# Patient Record
Sex: Male | Born: 1965 | Race: White | Hispanic: No | Smoking: Never smoker
Health system: Southern US, Community
[De-identification: ages and names within clinical notes are randomized; demographics above are authoritative.]

## PROBLEM LIST (undated history)

## (undated) DIAGNOSIS — G4733 Obstructive sleep apnea (adult) (pediatric): Secondary | ICD-10-CM

## (undated) DIAGNOSIS — F32A Depression, unspecified: Secondary | ICD-10-CM

## (undated) DIAGNOSIS — I10 Essential (primary) hypertension: Secondary | ICD-10-CM

## (undated) DIAGNOSIS — F329 Major depressive disorder, single episode, unspecified: Secondary | ICD-10-CM

## (undated) DIAGNOSIS — R0789 Other chest pain: Principal | ICD-10-CM

## (undated) DIAGNOSIS — K219 Gastro-esophageal reflux disease without esophagitis: Secondary | ICD-10-CM

## (undated) DIAGNOSIS — Z9989 Dependence on other enabling machines and devices: Secondary | ICD-10-CM

## (undated) DIAGNOSIS — R7989 Other specified abnormal findings of blood chemistry: Secondary | ICD-10-CM

## (undated) HISTORY — DX: Essential (primary) hypertension: I10

## (undated) HISTORY — DX: Other chest pain: R07.89

## (undated) HISTORY — DX: Other specified abnormal findings of blood chemistry: R79.89

## (undated) HISTORY — DX: Obstructive sleep apnea (adult) (pediatric): G47.33

## (undated) HISTORY — DX: Dependence on other enabling machines and devices: Z99.89

---

## 2006-02-17 HISTORY — PX: RHINOPLASTY: SUR1284

## 2006-08-13 ENCOUNTER — Ambulatory Visit (HOSPITAL_COMMUNITY): Admission: RE | Admit: 2006-08-13 | Discharge: 2006-08-14 | Payer: Self-pay | Admitting: Otolaryngology

## 2006-08-13 ENCOUNTER — Encounter (INDEPENDENT_AMBULATORY_CARE_PROVIDER_SITE_OTHER): Payer: Self-pay | Admitting: Otolaryngology

## 2010-04-15 ENCOUNTER — Other Ambulatory Visit: Payer: Self-pay | Admitting: Dermatology

## 2010-07-02 NOTE — Op Note (Signed)
NAMEYUVRAJ, PFEIFER                ACCOUNT NO.:  [id number]   MEDICAL RECORD NO.:  [id number]          PATIENT TYPE:  AMB   LOCATION:  SDS                          FACILITY:  [facility]   PHYSICIAN:  Newman Pies, MD            DATE OF BIRTH:  [birth year]   DATE OF PROCEDURE:  08/13/2006  DATE OF DISCHARGE:                               OPERATIVE REPORT   SURGEON:  Newman Pies, MD   PREOPERATIVE DIAGNOSES:  1. Significant adenoid hypertrophy.  2. Chronic nasal obstruction.  3. Severe nasal septal deviation.  4. Obstructive sleep apnea.   POSTOPERATIVE DIAGNOSES:  1. Significant adenoid hypertrophy.  2. Chronic nasal obstruction.  3. Severe nasal septal deviation.  4. Obstructive sleep apnea.   PROCEDURE PERFORMED:  1. Adenoidectomy.  2. Septoplasty.   ANESTHESIA:  General endotracheal tube anesthesia.   COMPLICATIONS:  None.   ESTIMATED BLOOD LOSS:  100 mL.   INDICATIONS FOR PROCEDURE:  Mr. Patrick Santos is a 45 year old white male  with a history of chronic nasal obstruction, obstructive sleep apnea,  and hyponasal voice.  On examination, he was noted to have significant  nasal septal deviation and significant adenoid hypertrophy.  On  fiberoptic nasopharyngoscopy, the adenoid was noted to be completely  obstructing the nasopharynx and choanal opening.  Based on the findings,  the decision was made for the patient to undergo adenoidectomy and  septoplasty.  The risks, benefits, alternatives, and details of the  procedure were discussed with the patient.  He would like to proceed  with no surgery.   DESCRIPTION OF PROCEDURE:  The patient was taken to the operating room  and placed supine on the operating table.  General endotracheal tube  anesthesia was administered by the anesthesiologist.  Preop IV  antibiotic and Decadron were given.  Next the patient was then  positioned and prepped and draped in standard fashion for adenoidectomy.  A Crowe-Davis mouth gag was inserted into the  oral cavity for exposure.  Inspection of the oral cavity reveals no submucous cleft or bifidity.  The patient has 1+ tonsils bilaterally.  However, he has a significantly  redundant oropharyngeal mucosa, especially the posterior tonsillar  pillar.  A red rubber catheter was inserted via the left nostril and was  used to gently retract the soft palate.  Indirect mirror examination of  the nasopharynx reveals significant adenoid hyperplasia, completely  obstructing the nasopharynx.  The adenoid was resected using the  electrocut adenotonsillar.  Hemostasis was achieved using the suction  electrocautery.   Attention was then turned towards the nose.  The patient was  repositioned and prepped and draped in a standard fashion for  septoplasty.  1% lidocaine 1:100,000 epinephrine was injected onto the  nasal septum bilaterally.  Hemitransfixion incision was made via the  left nostril.  The submucosal perichondrial flap was elevated on the  left side.  The patient is noted to have a significantly deviated nasal  septum, towards the left side.  A large bony spur is noted to be  projecting into the nasal cavity.  The  mucosa was carefully elevated  around the septal spur.  The submucoperichondrial flap was also elevated  on the contralateral side.  The flap was elevated posteriorly, in  continuity with a subperiosteal flap.  The deviated portion of the  cartilaginous and bony nasal septum was resected and removed.  A 1 cm  perforation was noted on the left mucosal flap.  The perforation was  repaired using 4-0 plain gut suture.  The septum was then quilted using  4-0 plain gut suture on a Keith needle.  Doyle splints were then applied  to both sides of the nasal septum.  At this time it is also noted that  the patient has significant turbinate hypertrophy, especially on the  right side.  The nasal septum was cauterized using a bipolar needle  cautery.  That concluded procedure for the patient.   The care of the  patient was turned over to the anesthesiologist.  The patient was  awakened from anesthesia without difficulty.  He was extubated and  transferred to the recovery room in good condition.   OPERATIVE FINDINGS:  1. Significant adenoid hypertrophy, completely obstructing the      nasopharynx.  Adenoids was resected free.  2. Significant septal deviation to the left.  A large septal spur is      also noted.   SPECIMENS REMOVED:  Adenoid tissue.   FOLLOW-UP:  The patient will be observed in the postanesthetic care  unit.  He will be subsequently transferred to the overnight observation  unit.  He will be discharged home on postop day #1.  He will be placed  on amoxicillin 500 mg p.o. t.i.d. for 7 days.  In addition, he may take  Percocet one to two tablet p.o. q.4-6 h p.r.n. pain.      Newman Pies, MD  Electronically Signed     ST/MEDQ  D:  08/13/2006  T:  08/13/2006  Job:  [id number]

## 2010-12-04 LAB — CBC
MCHC: 32.6
Platelets: 418 — ABNORMAL HIGH
RBC: 4.54
RDW: 15.5 — ABNORMAL HIGH

## 2012-12-11 ENCOUNTER — Encounter (HOSPITAL_COMMUNITY): Payer: Self-pay | Admitting: Emergency Medicine

## 2012-12-11 ENCOUNTER — Emergency Department (HOSPITAL_COMMUNITY)
Admission: EM | Admit: 2012-12-11 | Discharge: 2012-12-11 | Disposition: A | Discharge status: Home / Self Care | Payer: Managed Care, Other (non HMO) | Attending: Emergency Medicine | Admitting: Emergency Medicine

## 2012-12-11 DIAGNOSIS — K644 Residual hemorrhoidal skin tags: Secondary | ICD-10-CM | POA: Insufficient documentation

## 2012-12-11 DIAGNOSIS — F329 Major depressive disorder, single episode, unspecified: Secondary | ICD-10-CM | POA: Insufficient documentation

## 2012-12-11 DIAGNOSIS — F411 Generalized anxiety disorder: Secondary | ICD-10-CM | POA: Insufficient documentation

## 2012-12-11 DIAGNOSIS — G4700 Insomnia, unspecified: Secondary | ICD-10-CM | POA: Insufficient documentation

## 2012-12-11 DIAGNOSIS — F3289 Other specified depressive episodes: Secondary | ICD-10-CM | POA: Insufficient documentation

## 2012-12-11 DIAGNOSIS — E291 Testicular hypofunction: Secondary | ICD-10-CM | POA: Insufficient documentation

## 2012-12-11 DIAGNOSIS — R41840 Attention and concentration deficit: Secondary | ICD-10-CM | POA: Insufficient documentation

## 2012-12-11 DIAGNOSIS — F39 Unspecified mood [affective] disorder: Secondary | ICD-10-CM | POA: Insufficient documentation

## 2012-12-11 HISTORY — DX: Depression, unspecified: F32.A

## 2012-12-11 HISTORY — DX: Gastro-esophageal reflux disease without esophagitis: K21.9

## 2012-12-11 HISTORY — DX: Major depressive disorder, single episode, unspecified: F32.9

## 2012-12-11 LAB — COMPREHENSIVE METABOLIC PANEL
ALT: 15 U/L (ref 0–53)
AST: 15 U/L (ref 0–37)
Alkaline Phosphatase: 61 U/L (ref 39–117)
CO2: 26 mEq/L (ref 19–32)
Chloride: 101 mEq/L (ref 96–112)
GFR calc Af Amer: >90 mL/min (ref >90)
GFR calc non Af Amer: >90 mL/min (ref >90)
Glucose, Bld: 157 mg/dL — ABNORMAL HIGH (ref 70–99)
Potassium: 3.9 mEq/L (ref 3.5–5.1)
Sodium: 138 mEq/L (ref 135–145)

## 2012-12-11 LAB — CBC WITH DIFFERENTIAL/PLATELET
Lymphocytes Relative: 22 % (ref 12–46)
Lymphs Abs: 1.1 10*3/uL (ref 0.7–4.0)
MCV: 87.4 fL (ref 78.0–100.0)
Neutro Abs: 3.7 10*3/uL (ref 1.7–7.7)
Neutrophils Relative %: 72 % (ref 43–77)
Platelets: 285 10*3/uL (ref 150–400)
RBC: 5.14 MIL/uL (ref 4.22–5.81)
WBC: 5.1 10*3/uL (ref 4.0–10.5)

## 2012-12-11 LAB — URINALYSIS, ROUTINE W REFLEX MICROSCOPIC
Bilirubin Urine: NEGATIVE
Glucose, UA: 100 mg/dL — AB
Leukocytes, UA: NEGATIVE
Protein, ur: NEGATIVE mg/dL
Specific Gravity, Urine: 1.029 (ref 1.005–1.030)
pH: 6.5 (ref 5.0–8.0)

## 2012-12-11 LAB — T4, FREE: Free T4: 1.24 ng/dL (ref 0.80–1.80)

## 2012-12-11 LAB — TSH: TSH: 0.602 u[IU]/mL (ref 0.350–4.500)

## 2012-12-11 NOTE — ED Notes (Signed)
Pt reports is going through a separation, hasn't been sleeping well for 2 months. Yesterday went to urgent care and was prescribed Temazepam. Last night slept 3 hours, prior to that hasn't slept since Thursday. Reports wt loss of 27 lbs in 2 months, has cpap machine and thinks the setting may be wrong. Also states while he is here, he thinks he has a prolapsed rectum and hemorrhoid.

## 2012-12-11 NOTE — ED Provider Notes (Signed)
CSN: [id number]     Arrival date & time 12/11/12  0720 History   First MD Initiated Contact with Patient 12/11/12 [phone]     Chief Complaint  Patient presents with  . Insomnia    HPI  Patient presents with concerns of insomnia, and rectal lesion.  Concern #1. Patient states that he is going through separation/divorce.  Since this process started approximately 2 months ago he has had very poor sleep, with multiple nights of minimal sleep. He has seen urgent care providers for several times, including yesterday.    During this timeframe he has had persistent depression, anxiety, fixation on his life situation.  He specifically denies suicidal thoughts, homicidal thoughts, hallucinations. Regardless, the patient states that he has been unable to sleep, unable to work in a typical fashion. During this time he has lost 27 pounds.  Previously the patient was taking Xanax. Yesterday he was prescribed temazepam and Lexapro. He states that he continues to take his Xanax, 0.5 mg, as needed.  he has taken one dose of Lexapro.  Concern #2. Patient states that 4 weeks to months he has had a discomfort, worse with bowel movements.  Pain improves when not defecating. He is concerned of colon cancer. He had a colonoscopy several years ago due to persistent GERD. He reports that this study was unremarkable.  In addition to the patient's concern of insomnia, he notes concern for ongoing low testosterone.  He receives shots biweekly. Throughout this illness the patient has been speaking with his psychologist, has no psychiatric care.   Past Medical History  Diagnosis Date  . Depression   . GERD (gastroesophageal reflux disease)    History reviewed. No pertinent past surgical history. No family history on file. History  Substance Use Topics  . Smoking status: Never Smoker   . Smokeless tobacco: Not on file  . Alcohol Use: No    Review of Systems  Constitutional:       Per HPI, otherwise  negative  HENT:       Per HPI, otherwise negative  Respiratory:       Per HPI, otherwise negative  Cardiovascular:       Per HPI, otherwise negative  Gastrointestinal: Negative for nausea and vomiting.  Endocrine:       Negative aside from HPI  Genitourinary:       Neg aside from HPI   Musculoskeletal:       Per HPI, otherwise negative  Skin: Negative for wound.  Neurological: Negative for syncope.  Psychiatric/Behavioral: Positive for sleep disturbance, dysphoric mood and decreased concentration. Negative for suicidal ideas, hallucinations, behavioral problems, confusion, self-injury and agitation. The patient is nervous/anxious. The patient is not hyperactive.     Allergies  Review of patient's allergies indicates no known allergies.  Home Medications  No current outpatient prescriptions on file. BP 125/80  Pulse 92  Temp(Src) 97.8 F (36.6 C)  Ht 5\' 10"  (1.778 m)  Wt 179 lb (81.194 kg)  BMI 25.68 kg/m2  SpO2 100% Physical Exam  Nursing note and vitals reviewed. Constitutional: He is oriented to person, place, and time. He appears well-developed. No distress.  HENT:  Head: Normocephalic and atraumatic.  Eyes: Conjunctivae and EOM are normal.  Cardiovascular: Normal rate and regular rhythm.   Pulmonary/Chest: Effort normal. No stridor. No respiratory distress.  Abdominal: He exhibits no distension.  Genitourinary: Rectal exam shows external hemorrhoid. Rectal exam shows no fissure, no mass, no tenderness and anal tone normal.  Musculoskeletal: He exhibits  no edema.  Neurological: He is alert and oriented to person, place, and time.  Skin: Skin is warm and dry.  Psychiatric: Judgment normal. His mood appears not anxious. His affect is labile. His affect is not angry, not blunt and not inappropriate. His speech is not slurred. He is not actively hallucinating. Thought content is not paranoid and not delusional. Cognition and memory are normal. He exhibits a depressed mood.  He expresses no homicidal and no suicidal ideation. He expresses no suicidal plans and no homicidal plans. He is communicative. He is attentive.    ED Course  Procedures (including critical care time) Labs Review Labs Reviewed - No data to display Imaging Review No results found.  EKG Interpretation   None      9:55 AM I discussed all results with the patient.  He notes that he had a breakfast just prior to arrival. We specifically discussed return precautions, the need for further evaluation, management, monitoring with outpatient physicians. MDM  No diagnosis found. Patient presents with emotional lability, concern for insomnia, concern for hemorrhoids.  On exam the patient is medically stable, awake, alert, appropriate interactive.  He exhibits some evidence of depression, but no suicidal ideation, no homicidal ideation, no delusional thoughts.  Given the patient's recent prescription for new antidepressant, no longer acting benzodiazepine, we discussed these medications at length, the need for attempt with these, with close outpatient monitoring.  Patient was discharged in stable condition.    Gerhard Munch, MD 12/11/12 [phone]

## 2016-10-13 ENCOUNTER — Emergency Department (HOSPITAL_COMMUNITY): Payer: Commercial Managed Care - PPO

## 2016-10-13 ENCOUNTER — Emergency Department (HOSPITAL_COMMUNITY)
Admission: EM | Admit: 2016-10-13 | Discharge: 2016-10-13 | Discharge status: Against Medical Advice | Payer: Commercial Managed Care - PPO | Attending: Emergency Medicine | Admitting: Emergency Medicine

## 2016-10-13 ENCOUNTER — Encounter (HOSPITAL_COMMUNITY): Payer: Self-pay

## 2016-10-13 DIAGNOSIS — R079 Chest pain, unspecified: Secondary | ICD-10-CM | POA: Diagnosis present

## 2016-10-13 DIAGNOSIS — Z79899 Other long term (current) drug therapy: Secondary | ICD-10-CM | POA: Insufficient documentation

## 2016-10-13 LAB — CBC
HCT: 43.8 % (ref 39.0–52.0)
Hemoglobin: 15 g/dL (ref 13.0–17.0)
MCH: 30.9 pg (ref 26.0–34.0)
MCHC: 34.2 g/dL (ref 30.0–36.0)
MCV: 90.1 fL (ref 78.0–100.0)
Platelets: 263 10*3/uL (ref 150–400)
RBC: 4.86 MIL/uL (ref 4.22–5.81)
RDW: 13.7 % (ref 11.5–15.5)
WBC: 5.7 10*3/uL (ref 4.0–10.5)

## 2016-10-13 LAB — HEPATIC FUNCTION PANEL
ALT: 21 U/L (ref 17–63)
AST: 25 U/L (ref 15–41)
Albumin: 3.8 g/dL (ref 3.5–5.0)
Alkaline Phosphatase: 63 U/L (ref 38–126)
Bilirubin, Direct: 0.3 mg/dL (ref 0.1–0.5)
Indirect Bilirubin: 0.8 mg/dL (ref 0.3–0.9)
TOTAL PROTEIN: 6.7 g/dL (ref 6.5–8.1)
Total Bilirubin: 1.1 mg/dL (ref 0.3–1.2)

## 2016-10-13 LAB — BASIC METABOLIC PANEL
Anion gap: 9 (ref 5–15)
BUN: 15 mg/dL (ref 6–20)
CHLORIDE: 102 mmol/L (ref 101–111)
CO2: 27 mmol/L (ref 22–32)
Calcium: 9.3 mg/dL (ref 8.9–10.3)
Creatinine, Ser: 1.02 mg/dL (ref 0.61–1.24)
GFR calc Af Amer: >60 mL/min (ref >60)
Glucose, Bld: 108 mg/dL — ABNORMAL HIGH (ref 65–99)
Potassium: 3.7 mmol/L (ref 3.5–5.1)
SODIUM: 138 mmol/L (ref 135–145)

## 2016-10-13 LAB — I-STAT TROPONIN, ED
Troponin i, poc: 0 ng/mL (ref 0.00–0.08)
Troponin i, poc: 0 ng/mL (ref 0.00–0.08)

## 2016-10-13 MED ORDER — NITROGLYCERIN 0.4 MG SL SUBL: 0.4000 mg | SUBLINGUAL_TABLET | SUBLINGUAL | Status: DC | PRN

## 2016-10-13 MED ORDER — AMLODIPINE BESYLATE 2.5 MG PO TABS: 2.5000 mg | ORAL_TABLET | Freq: Every day | ORAL | 0 refills | Status: AC

## 2016-10-13 MED ORDER — ASPIRIN 81 MG PO CHEW: 324.0000 mg | CHEWABLE_TABLET | Freq: Once | ORAL | Status: AC

## 2016-10-13 NOTE — ED Notes (Signed)
ED Provider at bedside. 

## 2016-10-13 NOTE — ED Triage Notes (Signed)
Pt BIB [facility] from work meeting where pt had sudden onset of CP with radiation to left arm and left neck.Pt reports he was diaphoretic in his back. Pt reports similar episode X1 week ago but did not get evaluated. 324 ASA PTA. No nitro. Pt A&O on arrival, denies pain.

## 2016-10-13 NOTE — ED Notes (Signed)
Patient transported to X-ray 

## 2016-10-13 NOTE — ED Provider Notes (Signed)
[facility]ENCY DEPT Provider Note   CSN: [id number] Arrival date & time: 10/13/16  1256     History   Chief Complaint Chief Complaint  Patient presents with  . Chest Pain    HPI Patrick Santos is a 51 y.o. male.  HPI   Presents with chest pain. Lasted approximately 5 minutes at the highest severity, left sided chest tightness radiating to left neck and arm. Reports associated dyspnea, diaphoresis.  Reports this occurred 3-4 other times over last few months, has occurred spontaneously. This was the worst episode, had significant diaphoresis. Denies nausea, vomiting, headaches, abdominal pain. En route had mild 1/10 tightness in the chest. Received ASA but not nitro. Reports his pain is now 0/10.  Reports he does walk regularly without chest pain and that he has not had any exertional episodes, and that episode today was spontaneous.  No hx of DVT/PE/ACS. NO recent travel or immobilization or surgery.  Denies hx of smoking or family hx of CAD.   Past Medical History:  Diagnosis Date  . Depression   . GERD (gastroesophageal reflux disease)     There are no active problems to display for this patient.   Past Surgical History:  Procedure Laterality Date  . RHINOPLASTY  2008       Home Medications    Prior to Admission medications   Medication Sig Start Date End Date Taking? Authorizing Provider  CALCIUM PO Take 1 tablet by mouth See admin instructions. Take 1 tablet by mouth 5 times weekly   Yes [provider]  esomeprazole (NEXIUM) 20 MG capsule Take 20 mg by mouth daily at 12 noon.   Yes [provider]  glucosamine-chondroitin 500-400 MG tablet Take 1 tablet by mouth once a week.   Yes [provider]  OVER THE COUNTER MEDICATION Take 1 tablet by mouth daily as needed (for allergies). OTC [employer] Brand Allergy   Yes [provider]  Testosterone 10 MG/ACT (2%) GEL Place 2 Pump onto the skin See admin instructions. Apply 2 pumps to  both thighs daily 09/29/16  Yes [provider]  amLODipine (NORVASC) 2.5 MG tablet Take 1 tablet (2.5 mg total) by mouth daily. 10/13/16   Patrick Monday, MD    Family History No family history on file.  Social History Social History  Substance Use Topics  . Smoking status: Never Smoker  . Smokeless tobacco: Never Used  . Alcohol use No     Allergies   Shellfish allergy   Review of Systems Review of Systems  Constitutional: Positive for diaphoresis and fatigue. Negative for fever.  HENT: Negative for sore throat.   Eyes: Negative for visual disturbance.  Respiratory: Positive for shortness of breath.   Cardiovascular: Positive for chest pain.  Gastrointestinal: Negative for abdominal pain, nausea and vomiting.  Genitourinary: Negative for difficulty urinating.  Musculoskeletal: Negative for back pain and neck stiffness.  Skin: Negative for rash.  Neurological: Positive for light-headedness. Negative for syncope and headaches.     Physical Exam Updated Vital Signs BP (!) 140/98   Pulse 75   Temp 98.6 F (37 C) (Oral)   Resp 18   Ht 5\' 9"  (1.753 m)   Wt 99.8 kg (220 lb)   SpO2 99%   BMI 32.49 kg/m   Physical Exam  Constitutional: He is oriented to person, place, and time. He appears well-developed and well-nourished. No distress.  HENT:  Head: Normocephalic and atraumatic.  Eyes: Conjunctivae and EOM are normal.  Neck: Normal  range of motion.  Cardiovascular: Normal rate, regular rhythm, normal heart sounds and intact distal pulses.  Exam reveals no gallop and no friction rub.   No murmur heard. Pulmonary/Chest: Effort normal and breath sounds normal. No respiratory distress. He has no wheezes. He has no rales.  Abdominal: Soft. He exhibits no distension. There is no tenderness. There is no guarding.  Musculoskeletal: He exhibits no edema.  Neurological: He is alert and oriented to person, place, and time.  Skin: Skin is warm and dry. He is not  diaphoretic.  Nursing note and vitals reviewed.    ED Treatments / Results  Labs (all labs ordered are listed, but only abnormal results are displayed) Labs Reviewed  BASIC METABOLIC PANEL - Abnormal; Notable for the following:       Result Value   Glucose, Bld 108 (*)    All other components within normal limits  CBC  HEPATIC FUNCTION PANEL  I-STAT TROPONIN, ED  I-STAT TROPONIN, ED    EKG  EKG Interpretation  Date/Time:  Santos October 13 2016 12:56:57 EDT Ventricular Rate:  70 PR Interval:    QRS Duration: 105 QT Interval:  394 QTC Calculation: 426 R Axis:   -24 Text Interpretation:  Sinus rhythm Borderline left axis deviation RSR' in V1 or V2, right VCD or RVH No previous ECGs available Confirmed by Patrick Santos ([id number]) on 10/13/2016 1:08:25 PM Also confirmed by Patrick Santos ([id number]), editor Misty Stanley [phone])  on 10/13/2016 2:26:47 PM       Radiology Dg Chest 2 View  Result Date: 10/13/2016 CLINICAL DATA:  Chest pain EXAM: CHEST  2 VIEW COMPARISON:  None. FINDINGS: Normal heart size. Retrocardiac density is most likely due to a small to moderate hiatal hernia. Otherwise normal mediastinal contour. No pneumothorax. No pleural effusion. Lungs appear clear, with no acute consolidative airspace disease and no pulmonary edema. IMPRESSION: No active cardiopulmonary disease. Small to moderate hiatal hernia. Electronically Signed   By: Delbert Phenix M.D.   On: 10/13/2016 14:03    Procedures Procedures (including critical care time)  Medications Ordered in ED Medications  aspirin chewable tablet 324 mg (324 mg Oral Given by EMS 10/13/16 1347)     Initial Impression / Assessment and Plan / ED Course  I have reviewed the triage vital signs and the nursing notes.  Pertinent labs & imaging results that were available during my care of the patient were reviewed by me and considered in my medical decision making (see chart for details).    51 year old male  with history with history of GERD, OSA presents with concern for chest pain. Differential diagnosis for chest pain includes pulmonary embolus, dissection, pneumothorax, pneumonia, ACS, myocarditis, pericarditis.  EKG was done and evaluate by me and showed no acute ST changes and no signs of pericarditis. Chest x-ray was done and evaluated by me and radiology and showed no sign of pneumonia or pneumothorax. No persistent symptoms, no dyspnea, no hypoxia, no tachycardia, and have low suspicion for PE.  Doubt aortic dissection given history, normal bilateral upper and lower extremity pulses, normal XR.  Patient is high risk HEART score (4-5 given concerning history given, BMI elevated 31) and discussed recommendation for admission for further evaluation given risk for heart disease.   He had delta troponins which were both negative and has been chest pain free during time in the ED.  Discussed with patient that given his risks factors and history, however, recommend admission. He would like to leave against medical advice  and I have discussed the risk of heart attack, disability and death and he states understanding and is competent to make this decision.  Discussed with Cardiology and will arrange outpatient stress testing, he will be called tomorrow.     Final Clinical Impressions(s) / ED Diagnoses   Final diagnoses:  Chest pain, unspecified type    New Prescriptions Discharge Medication List as of 10/13/2016  5:09 PM       Patrick Monday, MD 10/13/16 2059

## 2016-10-14 ENCOUNTER — Other Ambulatory Visit: Payer: Self-pay | Admitting: Cardiovascular Disease

## 2016-10-14 DIAGNOSIS — R072 Precordial pain: Secondary | ICD-10-CM

## 2016-10-16 ENCOUNTER — Telehealth (HOSPITAL_COMMUNITY): Payer: Self-pay

## 2016-10-16 NOTE — Telephone Encounter (Signed)
Encounter complete. 

## 2016-10-21 ENCOUNTER — Ambulatory Visit (HOSPITAL_COMMUNITY)
Admission: RE | Admit: 2016-10-21 | Discharge: 2016-10-21 | Disposition: A | Discharge status: Home / Self Care | Payer: Commercial Managed Care - PPO | Source: Ambulatory Visit | Attending: Cardiovascular Disease | Admitting: Cardiovascular Disease

## 2016-10-21 ENCOUNTER — Encounter (HOSPITAL_COMMUNITY): Payer: Commercial Managed Care - PPO

## 2016-10-21 DIAGNOSIS — R072 Precordial pain: Secondary | ICD-10-CM | POA: Diagnosis not present

## 2016-10-21 LAB — EXERCISE TOLERANCE TEST
CHL CUP MPHR: 168 {beats}/min
CSEPEDS: 50 s
CSEPPHR: 193 {beats}/min
Estimated workload: 11.4 METS
Exercise duration (min): 9 min
Percent HR: 114 %
RPE: 17
Rest HR: 84 {beats}/min

## 2016-10-21 NOTE — Progress Notes (Signed)
Cardiology Office Note   Date:  10/22/2016   ID:  KIAM BRANSFIELD, DOB November 30, 1965, MRN [id number]  PCP:  Patient, No Pcp Per  Cardiologist:   Chilton Si, MD   Chief Complaint  Patient presents with  . New Patient (Initial Visit)      History of Present Illness: Patrick Santos is a 51 y.o. male with OSA on CPAP, osteopenia, depression and GERD who presents for an evaluation of chest pain.  [name] was seen in the ED 10/13/16 with chest pain. The episodes have been occurring intermittently over the preceding months. He was mildly hypertensive at 140/98.  EKG revealed sinus rhythm and no evidence of ischemia. Cardiac enzymes were negative x2. He was discharged with plans to follow-up with cardiology as an outpatient.  That day while at work he developed L sided chest aching with associated diaphoresis.  He has noted similar symptoms in the past while laying down in the bed. He notes that for the last 6 months he also has exertional chest discomfort and dyspnea. There is no associated nausea that he has noted diaphoresis.  Mr. Heal has noted constant discomfort in his L chest, neck and arm.  He also has tingling in the left hand.  He has not been exercising much lately.  He attributes this to being tired when he gets home from work.  He went through a divorce 4 years ago and has gained 50lb since that time.  He reports that his diet has been poor.  He struggles with carbs.  He denies lower extremity edema, orthopnea or PND.  [name] has been using a CPAP regularly.  He went for a titration study last week and his dose was increased from 13 to 21.  Past Medical History:  Diagnosis Date  . Atypical chest pain 10/22/2016  . Depression   . Essential hypertension 10/22/2016  . GERD (gastroesophageal reflux disease)   . Low testosterone 10/22/2016  . OSA on CPAP 10/22/2016    Past Surgical History:  Procedure Laterality Date  . RHINOPLASTY  2008     Current Outpatient Prescriptions    Medication Sig Dispense Refill  . amLODipine (NORVASC) 2.5 MG tablet Take 1 tablet (2.5 mg total) by mouth daily. 30 tablet 0  . CALCIUM PO Take 1 tablet by mouth See admin instructions. Take 1 tablet by mouth 5 times weekly    . esomeprazole (NEXIUM) 20 MG capsule Take 20 mg by mouth daily at 12 noon.    Marland Kitchen glucosamine-chondroitin 500-400 MG tablet Take 1 tablet by mouth once a week.    Marland Kitchen OVER THE COUNTER MEDICATION Take 1 tablet by mouth daily as needed (for allergies). OTC [employer] Brand Allergy    . Testosterone 10 MG/ACT (2%) GEL Place 2 Pump onto the skin See admin instructions. Apply 2 pumps to both thighs daily     No current facility-administered medications for this visit.     Allergies:   Shellfish allergy    Social History:  The patient  reports that he has never smoked. He has never used smokeless tobacco. He reports that he does not drink alcohol or use drugs.   Family History:  The patient's family history includes Anxiety disorder in his sister; Diabetes in his brother and sister; Lung cancer in his father.    ROS:  Please see the history of present illness.   Otherwise, review of systems are positive for none.   All other systems are reviewed and  negative.    PHYSICAL EXAM: VS:  BP 128/82   Pulse 92   Ht 5\' 10"  (1.778 m)   Wt 99.8 kg (220 lb)   BMI 31.57 kg/m  , BMI Body mass index is 31.57 kg/m. GENERAL:  Well appearing HEENT:  Pupils equal round and reactive, fundi not visualized, oral mucosa unremarkable NECK:  No jugular venous distention, waveform within normal limits, carotid upstroke brisk and symmetric, no bruits, no thyromegaly LYMPHATICS:  No cervical adenopathy LUNGS:  Clear to auscultation bilaterally HEART:  RRR.  PMI not displaced or sustained,S1 and S2 within normal limits, no S3, no S4, no clicks, no rubs,  murmurs ABD:  Flat, positive bowel sounds normal in frequency in pitch, no bruits, no rebound, no guarding, no midline pulsatile mass, no  hepatomegaly, no splenomegaly EXT:  2 plus pulses throughout, no edema, no cyanosis no clubbing SKIN:  No rashes no nodules NEURO:  Cranial nerves II through XII grossly intact, motor grossly intact throughout PSYCH:  Cognitively intact, oriented to person place and time  EKG:  EKG is not ordered today. The ekg ordered 10/14/16 demonstrates Sinus rhythm. Rate 70 bpm.   Recent Labs: 10/13/2016: ALT 21; BUN 15; Creatinine, Ser 1.02; Hemoglobin 15.0; Platelets 263; Potassium 3.7; Sodium 138    Lipid Panel No results found for: CHOL, TRIG, HDL, CHOLHDL, VLDL, LDLCALC, LDLDIRECT    Wt Readings from Last 3 Encounters:  10/22/16 99.8 kg (220 lb)  10/13/16 99.8 kg (220 lb)  12/11/12 81.2 kg (179 lb)      ASSESSMENT AND PLAN:  # Atypical chest pain: Symptoms are atypical and ETT was negative for ischemia.  I suspect that his symptoms are radicular 2/2 cervical spine disease.  We will get a C-spine xray.  We will also get a coronary CT-A given his persistent symptoms and concern for CAD.  # OSA: Continue titration as planned.  # Hypertension: BP controlled on amlodipine.   # Low testosterone: Patient reports fatigue.  Check level.   Current medicines are reviewed at length with the patient today.  The patient does not have concerns regarding medicines.  The following changes have been made:  no change  Labs/ tests ordered today include:   Orders Placed This Encounter  Procedures  . DG Cervical Spine Complete  . CT CORONARY MORPH W/CTA COR W/SCORE W/CA W/CM &/OR WO/CM  . CT CORONARY FRACTIONAL FLOW RESERVE DATA PREP  . CT CORONARY FRACTIONAL FLOW RESERVE FLUID ANALYSIS  . Comprehensive metabolic panel  . Lipid panel  . Testosterone     Disposition:   FU with Dynasia Kercheval C. Duke Salviaandolph, MD, [facility] in one month.   This note was written with the assistance of speech recognition software.  Please excuse any transcriptional errors.  Signed, Eleina Jergens C. Duke Salviaandolph, MD, [facility]  10/22/2016  5:37 PM    [facility]

## 2016-10-22 ENCOUNTER — Ambulatory Visit (INDEPENDENT_AMBULATORY_CARE_PROVIDER_SITE_OTHER): Payer: Commercial Managed Care - PPO | Admitting: Cardiovascular Disease

## 2016-10-22 ENCOUNTER — Encounter: Payer: Self-pay | Admitting: Cardiovascular Disease

## 2016-10-22 VITALS — BP 128/82 | HR 92 | Ht 70.0 in | Wt 220.0 lb

## 2016-10-22 DIAGNOSIS — M542 Cervicalgia: Secondary | ICD-10-CM

## 2016-10-22 DIAGNOSIS — G4733 Obstructive sleep apnea (adult) (pediatric): Secondary | ICD-10-CM | POA: Diagnosis not present

## 2016-10-22 DIAGNOSIS — Z9989 Dependence on other enabling machines and devices: Secondary | ICD-10-CM | POA: Diagnosis not present

## 2016-10-22 DIAGNOSIS — M79602 Pain in left arm: Secondary | ICD-10-CM

## 2016-10-22 DIAGNOSIS — Z13220 Encounter for screening for lipoid disorders: Secondary | ICD-10-CM

## 2016-10-22 DIAGNOSIS — R7989 Other specified abnormal findings of blood chemistry: Secondary | ICD-10-CM | POA: Diagnosis not present

## 2016-10-22 DIAGNOSIS — R0789 Other chest pain: Secondary | ICD-10-CM | POA: Diagnosis not present

## 2016-10-22 DIAGNOSIS — I10 Essential (primary) hypertension: Secondary | ICD-10-CM | POA: Diagnosis not present

## 2016-10-22 HISTORY — DX: Essential (primary) hypertension: I10

## 2016-10-22 HISTORY — DX: Obstructive sleep apnea (adult) (pediatric): G47.33

## 2016-10-22 HISTORY — DX: Other chest pain: R07.89

## 2016-10-22 HISTORY — DX: Other specified abnormal findings of blood chemistry: R79.89

## 2016-10-22 NOTE — Patient Instructions (Addendum)
Medication Instructions:  Your physician recommends that you continue on your current medications as directed. Please refer to the Current Medication list given to you today.  Labwork: FASTING LP/CMET/TESTOSTERONE LEVEL SOON  Testing/Procedures: CARDIAC CTA  CERVICAL SPINE XRAY AT [facility]   Follow-Up: 1 MONTH OV   If you need a refill on your cardiac medications before your next appointment, please call your pharmacy.   Cardiac CT Angiogram A cardiac CT angiogram is a procedure to look at the heart and the area around the heart. It may be done to help find the cause of chest pains or other symptoms of heart disease. During this procedure, a large X-ray machine, called a CT scanner, takes detailed pictures of the heart and the surrounding area after a dye (contrast material) has been injected into blood vessels in the area. The procedure is also sometimes called a coronary CT angiogram, coronary artery scanning, or CTA. A cardiac CT angiogram allows the health care provider to see how well blood is flowing to and from the heart. The health care provider will be able to see if there are any problems, such as:  Blockage or narrowing of the coronary arteries in the heart.  Fluid around the heart.  Signs of weakness or disease in the muscles, valves, and tissues of the heart.  Tell a health care provider about:  Any allergies you have. This is especially important if you have had a previous allergic reaction to contrast dye.  All medicines you are taking, including vitamins, herbs, eye drops, creams, and over-the-counter medicines.  Any blood disorders you have.  Any surgeries you have had.  Any medical conditions you have.  Whether you are pregnant or may be pregnant.  Any anxiety disorders, chronic pain, or other conditions you have that may increase your stress or prevent you from lying still. What are the risks? Generally, this is a safe procedure. However,  problems may occur, including:  Bleeding.  Infection.  Allergic reactions to medicines or dyes.  Damage to other structures or organs.  Kidney damage from the dye or contrast that is used.  Increased risk of cancer from radiation exposure. This risk is low. Talk with your health care provider about: ? The risks and benefits of testing. ? How you can receive the lowest dose of radiation.  What happens before the procedure?  Wear comfortable clothing and remove any jewelry, glasses, dentures, and hearing aids.  Follow instructions from your health care provider about eating and drinking. This may include: ? For 12 hours before the test - avoid caffeine. This includes tea, coffee, soda, energy drinks, and diet pills. Drink plenty of water or other fluids that do not have caffeine in them. Being well-hydrated can prevent complications. ? For 4-6 hours before the test - stop eating and drinking. The contrast dye can cause nausea, but this is less likely if your stomach is empty.  Ask your health care provider about changing or stopping your regular medicines. This is especially important if you are taking diabetes medicines, blood thinners, or medicines to treat erectile dysfunction. What happens during the procedure?  Hair on your chest may need to be removed so that small sticky patches called electrodes can be placed on your chest. These will transmit information that helps to monitor your heart during the test.  An IV tube will be inserted into one of your veins.  You might be given a medicine to control your heart rate during the test. This  will help to ensure that good images are obtained.  You will be asked to lie on an exam table. This table will slide in and out of the CT machine during the procedure.  Contrast dye will be injected into the IV tube. You might feel warm, or you may get a metallic taste in your mouth.  You will be given a medicine (nitroglycerin) to relax  (dilate) the arteries in your heart.  The table that you are lying on will move into the CT machine tunnel for the scan.  The person running the machine will give you instructions while the scans are being done. You may be asked to: ? Keep your arms above your head. ? Hold your breath. ? Stay very still, even if the table is moving.  When the scanning is complete, you will be moved out of the machine.  The IV tube will be removed. The procedure may vary among health care providers and hospitals. What happens after the procedure?  You might feel warm, or you may get a metallic taste in your mouth from the contrast dye.  You may have a headache from the nitroglycerin.  After the procedure, drink water or other fluids to wash (flush) the contrast material out of your body.  Contact a health care provider if you have any symptoms of allergy to the contrast. These symptoms include: ? Shortness of breath. ? Rash or hives. ? A racing heartbeat.  Most people can return to their normal activities right after the procedure. Ask your health care provider what activities are safe for you.  It is up to you to get the results of your procedure. Ask your health care provider, or the department that is doing the procedure, when your results will be ready. Summary  A cardiac CT angiogram is a procedure to look at the heart and the area around the heart. It may be done to help find the cause of chest pains or other symptoms of heart disease.  During this procedure, a large X-ray machine, called a CT scanner, takes detailed pictures of the heart and the surrounding area after a dye (contrast material) has been injected into blood vessels in the area.  Ask your health care provider about changing or stopping your regular medicines before the procedure. This is especially important if you are taking diabetes medicines, blood thinners, or medicines to treat erectile dysfunction.  After the procedure,  drink water or other fluids to wash (flush) the contrast material out of your body. This information is not intended to replace advice given to you by your health care provider. Make sure you discuss any questions you have with your health care provider. Document Released: 01/17/2008 Document Revised: 12/24/2015 Document Reviewed: 12/24/2015 Elsevier Interactive Patient Education  2017 [employer].

## 2016-11-11 ENCOUNTER — Encounter: Payer: Self-pay | Admitting: Cardiovascular Disease

## 2016-11-13 ENCOUNTER — Ambulatory Visit
Admission: RE | Admit: 2016-11-13 | Discharge: 2016-11-13 | Disposition: A | Discharge status: Home / Self Care | Payer: Commercial Managed Care - PPO | Source: Ambulatory Visit | Attending: Cardiovascular Disease | Admitting: Cardiovascular Disease

## 2016-11-13 ENCOUNTER — Ambulatory Visit (HOSPITAL_COMMUNITY): Admission: RE | Admit: 2016-11-13 | Payer: Commercial Managed Care - PPO | Source: Ambulatory Visit

## 2016-11-13 ENCOUNTER — Ambulatory Visit (HOSPITAL_COMMUNITY)
Admission: RE | Admit: 2016-11-13 | Discharge: 2016-11-13 | Disposition: A | Discharge status: Home / Self Care | Payer: Commercial Managed Care - PPO | Source: Ambulatory Visit | Attending: Cardiovascular Disease | Admitting: Cardiovascular Disease

## 2016-11-13 DIAGNOSIS — M542 Cervicalgia: Secondary | ICD-10-CM

## 2016-11-13 DIAGNOSIS — M79602 Pain in left arm: Secondary | ICD-10-CM

## 2016-11-13 DIAGNOSIS — R079 Chest pain, unspecified: Secondary | ICD-10-CM | POA: Diagnosis not present

## 2016-11-13 DIAGNOSIS — R0789 Other chest pain: Secondary | ICD-10-CM | POA: Insufficient documentation

## 2016-11-13 DIAGNOSIS — K449 Diaphragmatic hernia without obstruction or gangrene: Secondary | ICD-10-CM | POA: Insufficient documentation

## 2016-11-13 MED ORDER — METOPROLOL TARTRATE 5 MG/5ML IV SOLN
INTRAVENOUS | Status: AC
  Administered 2016-11-13: 15 mg
  Filled 2016-11-13: qty 15

## 2016-11-13 MED ORDER — NITROGLYCERIN 0.4 MG SL SUBL
SUBLINGUAL_TABLET | SUBLINGUAL | Status: AC
  Filled 2016-11-13: qty 1

## 2016-11-13 MED ORDER — NITROGLYCERIN 0.4 MG SL SUBL: 0.4000 mg | SUBLINGUAL_TABLET | SUBLINGUAL | Status: DC | PRN

## 2016-11-13 MED ORDER — IOPAMIDOL (ISOVUE-370) INJECTION 76%
INTRAVENOUS | Status: AC
  Filled 2016-11-13: qty 100

## 2016-11-14 LAB — COMPREHENSIVE METABOLIC PANEL
ALK PHOS: 68 IU/L (ref 39–117)
ALT: 21 IU/L (ref 0–44)
AST: 18 IU/L (ref 0–40)
Albumin/Globulin Ratio: 1.7 (ref 1.2–2.2)
Albumin: 4.3 g/dL (ref 3.5–5.5)
BILIRUBIN TOTAL: 0.4 mg/dL (ref 0.0–1.2)
BUN/Creatinine Ratio: 14 (ref 9–20)
BUN: 15 mg/dL (ref 6–24)
CO2: 22 mmol/L (ref 20–29)
Calcium: 9.2 mg/dL (ref 8.7–10.2)
Chloride: 101 mmol/L (ref 96–106)
Creatinine, Ser: 1.07 mg/dL (ref 0.76–1.27)
GFR calc non Af Amer: 79 mL/min/{1.73_m2} (ref >59)
GFR, EST AFRICAN AMERICAN: 92 mL/min/{1.73_m2} (ref >59)
Globulin, Total: 2.6 g/dL (ref 1.5–4.5)
Glucose: 113 mg/dL — ABNORMAL HIGH (ref 65–99)
Potassium: 4.4 mmol/L (ref 3.5–5.2)
SODIUM: 141 mmol/L (ref 134–144)
Total Protein: 6.9 g/dL (ref 6.0–8.5)

## 2016-11-14 LAB — LIPID PANEL
CHOLESTEROL TOTAL: 206 mg/dL — AB (ref 100–199)
Chol/HDL Ratio: 4.6 ratio (ref 0.0–5.0)
HDL: 45 mg/dL (ref >39)
LDL Calculated: 125 mg/dL — ABNORMAL HIGH (ref 0–99)
Triglycerides: 179 mg/dL — ABNORMAL HIGH (ref 0–149)
VLDL Cholesterol Cal: 36 mg/dL (ref 5–40)

## 2016-11-14 LAB — TESTOSTERONE: Testosterone: 365 ng/dL (ref 264–916)

## 2016-11-18 ENCOUNTER — Telehealth: Payer: Self-pay | Admitting: *Deleted

## 2016-11-18 NOTE — Telephone Encounter (Signed)
Advised patient of results. Patient wanted to know if he needed follow up appointment since testing ok. Discussed with Dr Duke Salvia and ok to cancel if he is doing ok. Left message to call back   Notes recorded by Chilton Si, MD on 11/18/2016 at 6:23 AM EDT Normal coronary arteries. No plaque.

## 2016-11-20 NOTE — Telephone Encounter (Signed)
Advised patient and he will think about it and cancel if he chooses not to come

## 2016-12-16 ENCOUNTER — Ambulatory Visit: Payer: Commercial Managed Care - PPO | Admitting: Cardiovascular Disease

## 2018-01-04 IMAGING — CT CT HEART MORP W/ CTA COR W/ SCORE W/ CA W/CM &/OR W/O CM
4 of 7 series · 8 of 20 positions shown, 9 images · non-contrast
Comparison: None.

CLINICAL DATA: Chest pain

EXAM:
Cardiac CTA
MEDICATIONS:
Sub lingual nitro. 4mg and lopressor 5mg IV
TECHNIQUE: The patient was scanned on a Siemens [REDACTED]ice scanner. Gantry
rotation speed was 240 msecs. Collimation was 0.6 mm. A 100 kV
prospective scan was triggered in the ascending thoracic aorta at
111 HU's centered around 65-75% of the R-R interval. Average HR
during the scan was 65 bpm. The 3D data set was interpreted on a
dedicated work station using MPR, MIP and VRT modes. A total of 80cc
of contrast was used.

[Series 6: best diast 69 % · axial · 0.44mm/px · z∈[+1141,+1206]mm · 2 of 491 slices shown, 3 images]
[im 164/491  vessel]
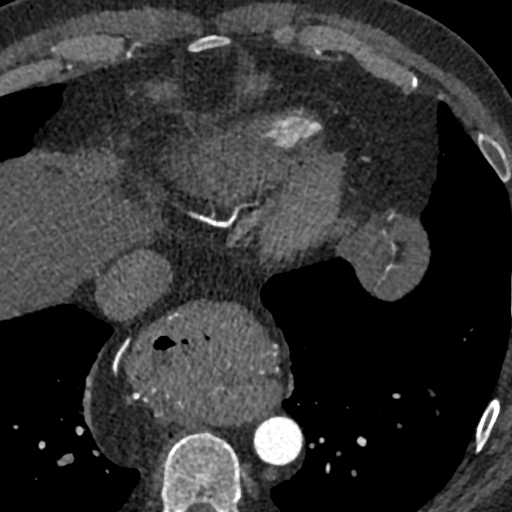
[im 164/491  lung]
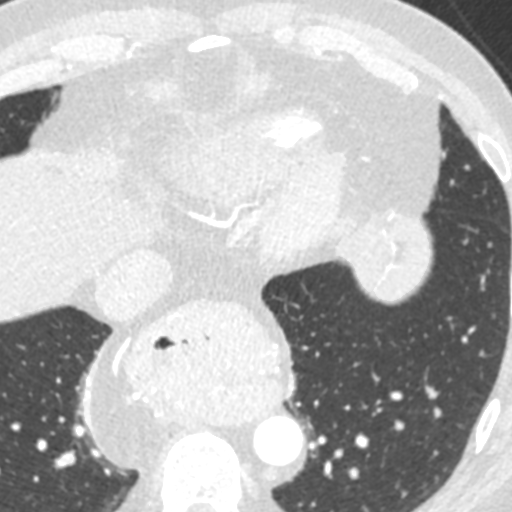
[im 327/491  vessel]
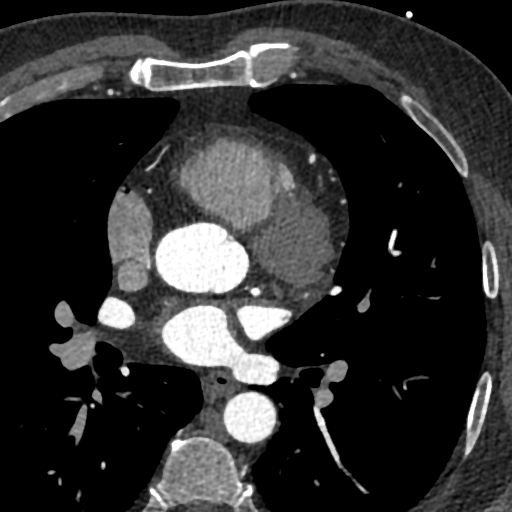

[Series 7: best syst 40 % · axial · 0.44mm/px · z∈[+1141,+1206]mm · 2 of 491 slices shown]
[im 164/491  vessel]
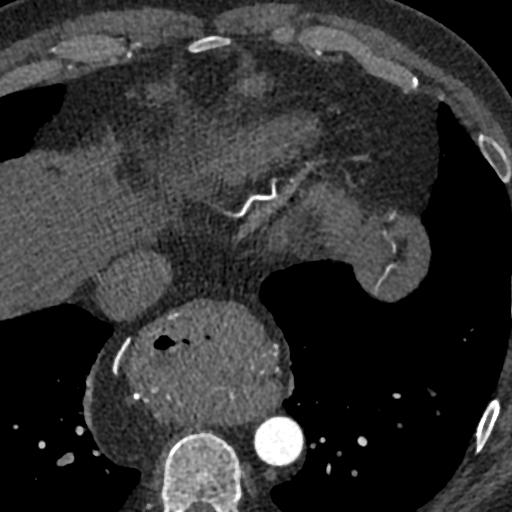
[im 327/491  vessel]
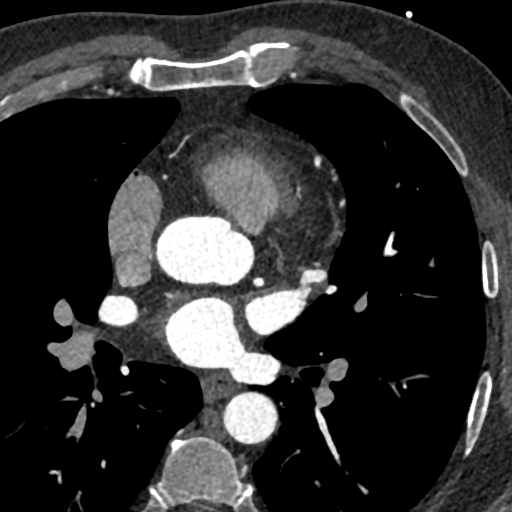

[Series 8: ts diast sharp 69 % · axial · 0.44mm/px · z∈[+1141,+1206]mm · 2 of 491 slices shown]
[im 164/491  lung]
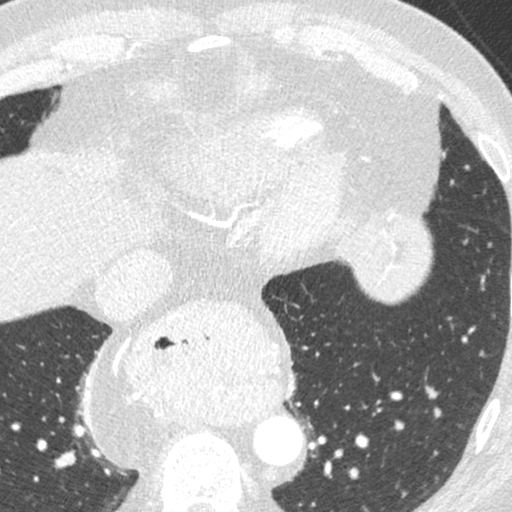
[im 327/491  lung]
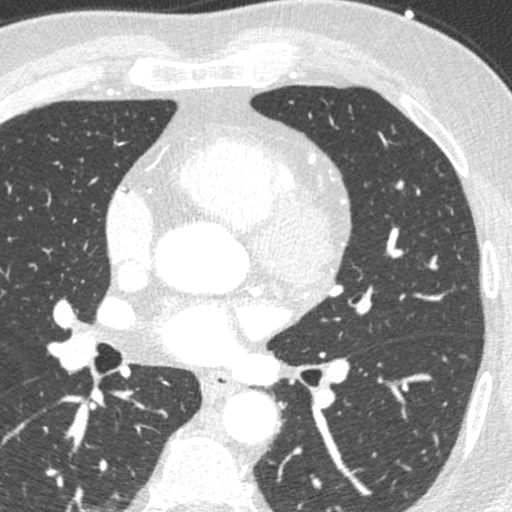

[Series 9: ts syst sharp 40 % · axial · 0.44mm/px · z∈[+1141,+1206]mm · 2 of 491 slices shown]
[im 164/491  lung]
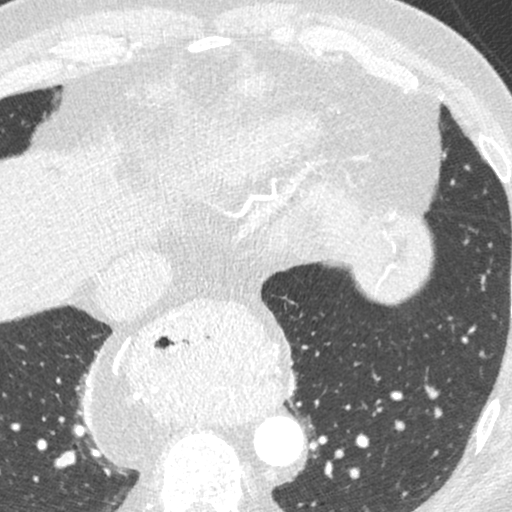
[im 327/491  lung]
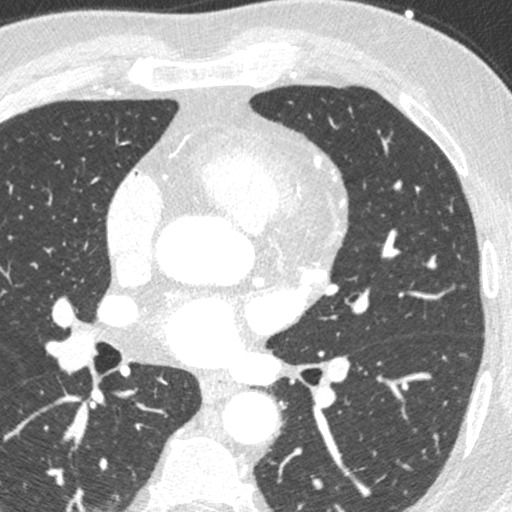

[8 of 20 positions shown; findings below may reference images not displayed]

FINDINGS: Non-cardiac: See separate report from [REDACTED].

Calcium Score:  0 Agatston units.

Coronary Arteries: Right dominant with no anomalies

LM:  No plaque or stenosis.

LAD system:  No plaque or stenosis.

Circumflex system:  No plaque or stenosis.

RCA system:  No plaque or stenosis
IMPRESSION: 1. Coronary calcium score of 0 Agatston units, suggesting low risk
for future cardiac events.

2.  No plaque or stenosis in the coronary arteries.

Mofiz Thrissur

EXAM:
OVER-READ INTERPRETATION  CT CHEST

The following report is an over-read performed by radiologist Dr.
Sakura Pau [REDACTED] on 11/13/2016. This over-read
does not include interpretation of cardiac or coronary anatomy or
pathology. The coronary CTA interpretation by the cardiologist is
attached.
FINDINGS: Cardiovascular: Heart is normal size.  Aorta is normal caliber.

Mediastinum/Nodes: No adenopathy in the lower mediastinum or hila.
Large hiatal hernia.

Lungs/Pleura: No confluent airspace opacities or effusions.

Upper Abdomen: Imaging into the upper abdomen shows no acute
findings.

Musculoskeletal: Chest wall soft tissues are unremarkable. No acute
bony abnormality.
IMPRESSION: Large hiatal hernia.

No acute extra cardiac abnormality.

## 2018-01-04 IMAGING — CR DG CERVICAL SPINE COMPLETE 4+V
6 series · 6 of 6 positions shown · non-contrast
Comparison: None.

CLINICAL DATA: Left arm pain and numbness for 1 year

EXAM:
CERVICAL SPINE - COMPLETE 4+ VIEW

[w cervical spine lat]
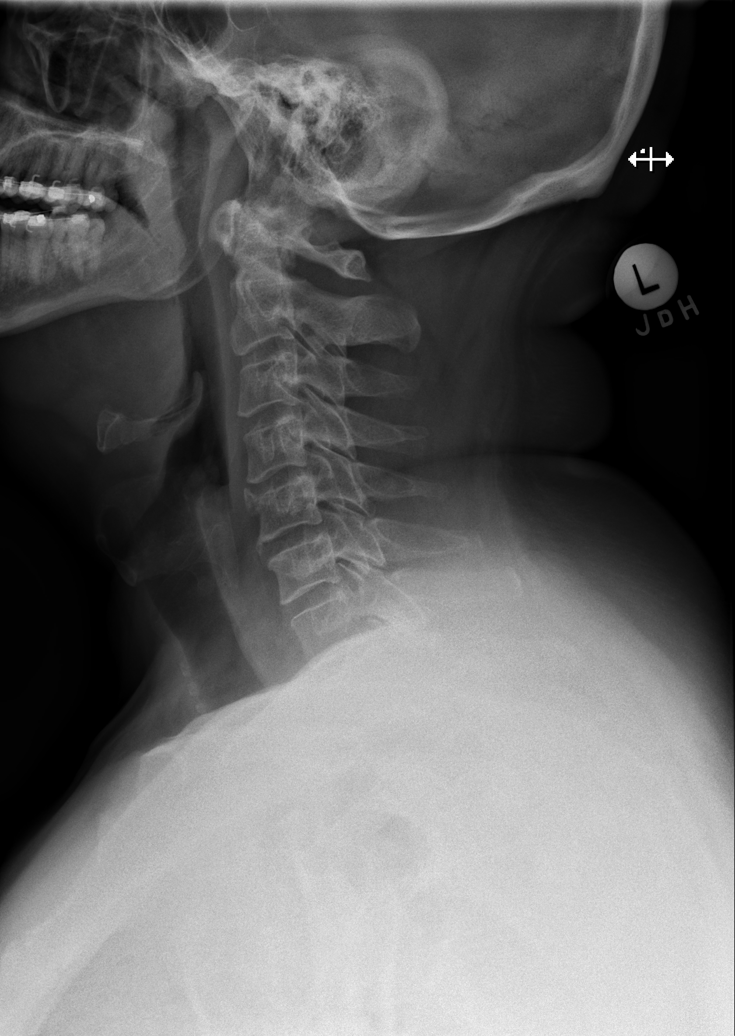

[w cervical spine ap_obl (1 of 2)]
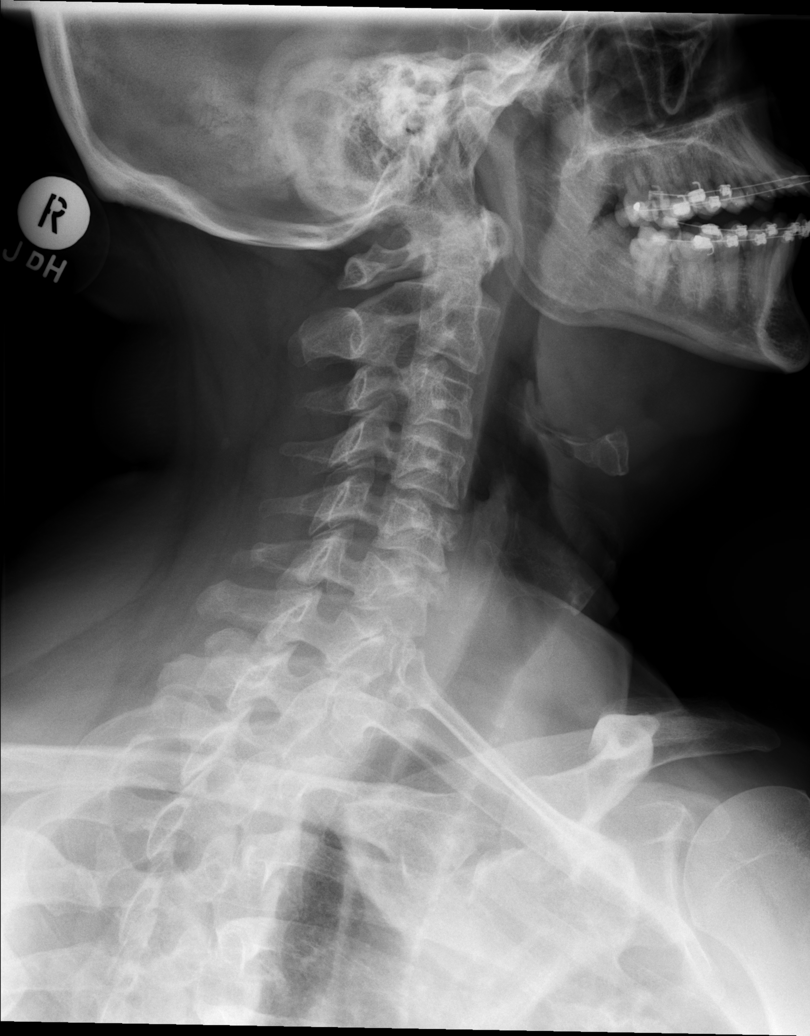

[w cervical spine ap_obl (2 of 2)]
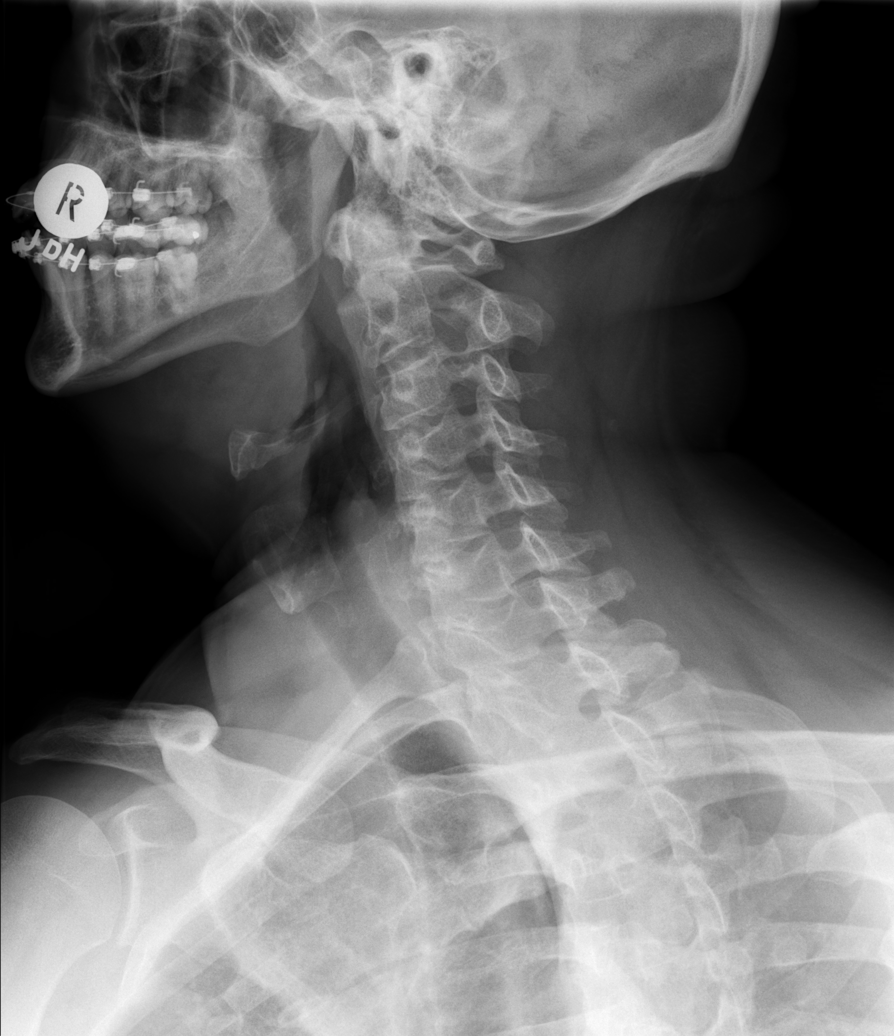

[w cervical spine ap]
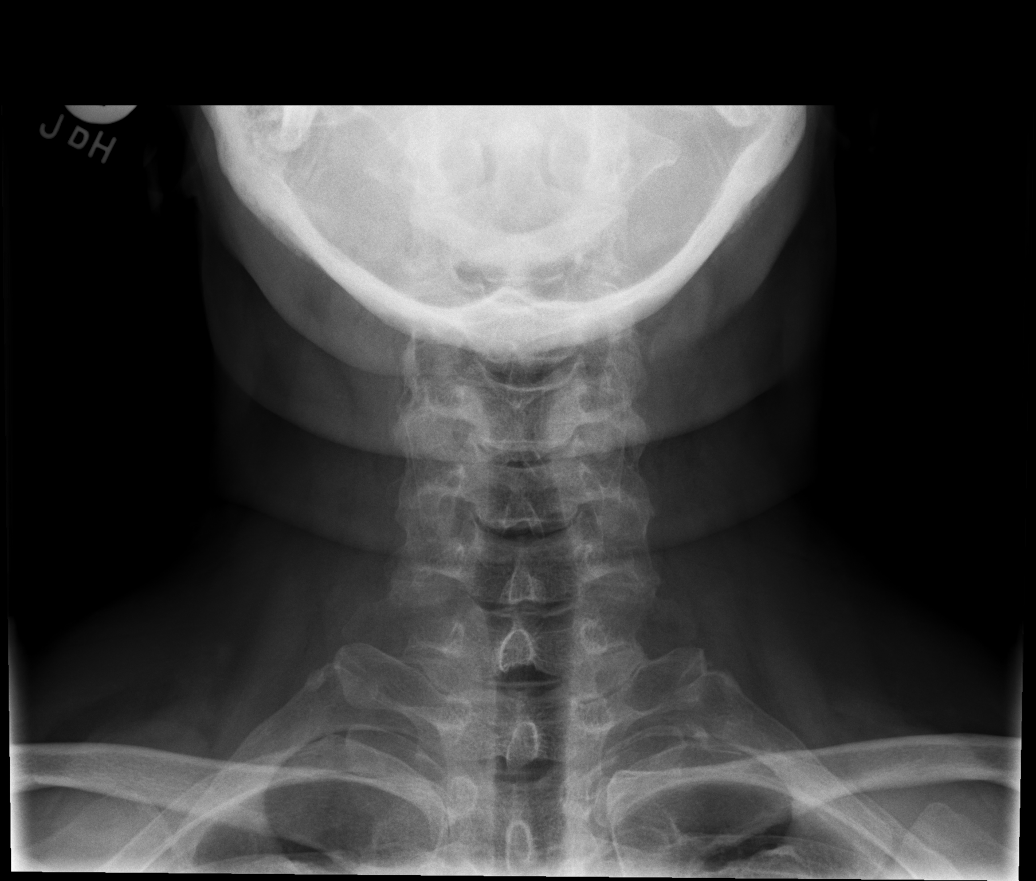

[w cervical swimmers]
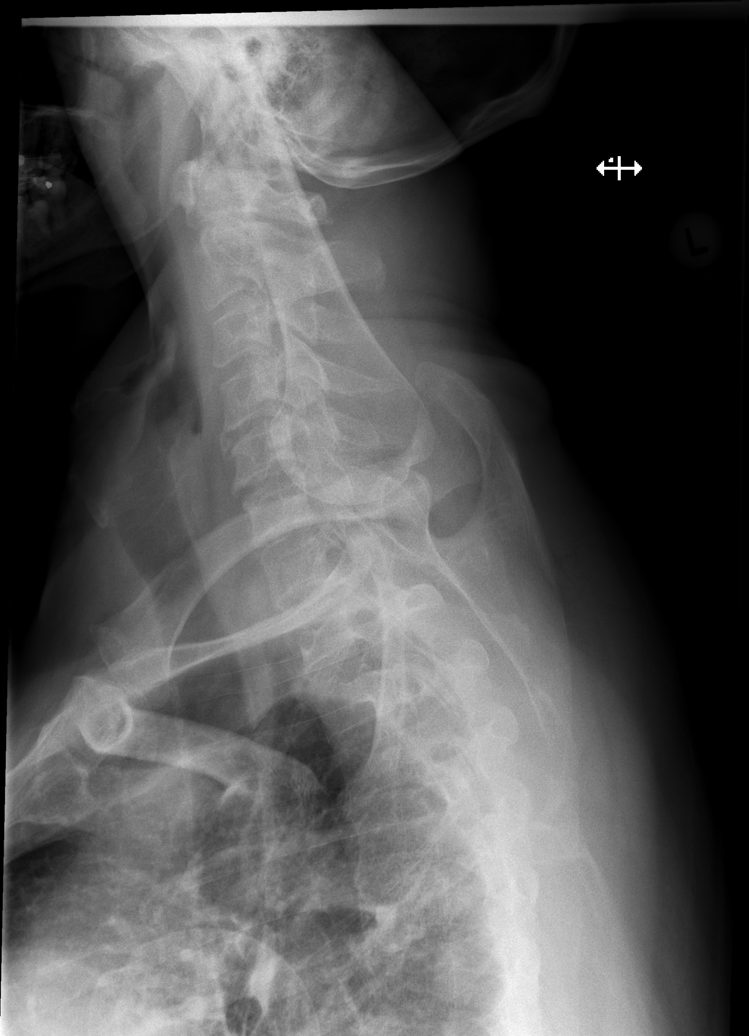

[w cervical spine odontoid]
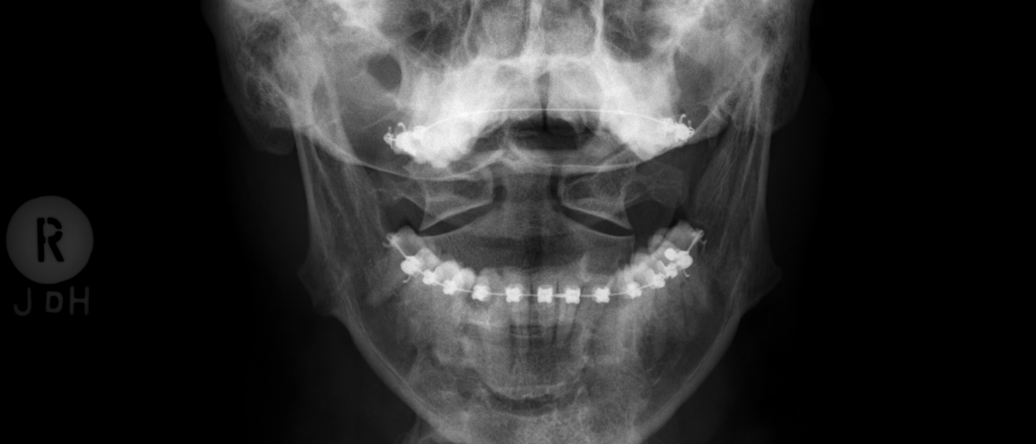

[6 of 6 positions shown; findings below may reference images not displayed]

FINDINGS: Seven cervical segments are well visualized. Vertebral body height
is well maintained. Osteophytic changes are noted at C4-5 and C5-6.
No neural foraminal changes are noted. No acute fracture or acute
facet abnormality is seen. No soft tissue abnormality is noted. The
odontoid is within normal limits.
IMPRESSION: Mild degenerative change without acute abnormality.
